# Patient Record
Sex: Male | Born: 1965
Health system: Southern US, Community
[De-identification: ages and names within clinical notes are randomized; demographics above are authoritative.]

---

## 2015-04-30 ENCOUNTER — Emergency Department (HOSPITAL_BASED_OUTPATIENT_CLINIC_OR_DEPARTMENT_OTHER): Payer: 59

## 2015-04-30 ENCOUNTER — Observation Stay (HOSPITAL_BASED_OUTPATIENT_CLINIC_OR_DEPARTMENT_OTHER)
Admission: EM | Admit: 2015-04-30 | Discharge: 2015-05-01 | Disposition: A | Discharge status: Home / Self Care | Payer: 59 | Attending: Surgery | Admitting: Surgery

## 2015-04-30 ENCOUNTER — Emergency Department (HOSPITAL_COMMUNITY): Payer: 59 | Admitting: Certified Registered"

## 2015-04-30 ENCOUNTER — Encounter (HOSPITAL_BASED_OUTPATIENT_CLINIC_OR_DEPARTMENT_OTHER): Payer: Self-pay | Admitting: Emergency Medicine

## 2015-04-30 ENCOUNTER — Encounter (HOSPITAL_COMMUNITY)
Admission: EM | Disposition: A | Discharge status: Home / Self Care | Payer: Self-pay | Source: Home / Self Care | Attending: Emergency Medicine

## 2015-04-30 DIAGNOSIS — K353 Acute appendicitis with localized peritonitis: Secondary | ICD-10-CM | POA: Diagnosis not present

## 2015-04-30 DIAGNOSIS — Z9049 Acquired absence of other specified parts of digestive tract: Secondary | ICD-10-CM

## 2015-04-30 DIAGNOSIS — K37 Unspecified appendicitis: Secondary | ICD-10-CM

## 2015-04-30 DIAGNOSIS — R1031 Right lower quadrant pain: Secondary | ICD-10-CM | POA: Diagnosis present

## 2015-04-30 DIAGNOSIS — K3580 Unspecified acute appendicitis: Secondary | ICD-10-CM

## 2015-04-30 HISTORY — PX: LAPAROSCOPIC APPENDECTOMY: SHX408

## 2015-04-30 LAB — COMPREHENSIVE METABOLIC PANEL
ALT: 28 U/L (ref 17–63)
ANION GAP: 7 (ref 5–15)
AST: 38 U/L (ref 15–41)
Albumin: 4.2 g/dL (ref 3.5–5.0)
Alkaline Phosphatase: 76 U/L (ref 38–126)
BILIRUBIN TOTAL: 1.2 mg/dL (ref 0.3–1.2)
BUN: 12 mg/dL (ref 6–20)
CHLORIDE: 104 mmol/L (ref 101–111)
CO2: 25 mmol/L (ref 22–32)
Calcium: 8.9 mg/dL (ref 8.9–10.3)
Creatinine, Ser: 1.34 mg/dL — ABNORMAL HIGH (ref 0.61–1.24)
GFR calc Af Amer: >60 mL/min (ref >60)
Glucose, Bld: 110 mg/dL — ABNORMAL HIGH (ref 65–99)
POTASSIUM: 3.9 mmol/L (ref 3.5–5.1)
Sodium: 136 mmol/L (ref 135–145)
TOTAL PROTEIN: 8 g/dL (ref 6.5–8.1)

## 2015-04-30 LAB — CBC
HCT: 40.7 % (ref 39.0–52.0)
HEMATOCRIT: 43.3 % (ref 39.0–52.0)
HEMOGLOBIN: 14.6 g/dL (ref 13.0–17.0)
Hemoglobin: 13.5 g/dL (ref 13.0–17.0)
MCH: 29.1 pg (ref 26.0–34.0)
MCH: 29.7 pg (ref 26.0–34.0)
MCHC: 33.2 g/dL (ref 30.0–36.0)
MCHC: 33.7 g/dL (ref 30.0–36.0)
MCV: 86.3 fL (ref 78.0–100.0)
MCV: 89.5 fL (ref 78.0–100.0)
PLATELETS: 216 10*3/uL (ref 150–400)
Platelets: 225 10*3/uL (ref 150–400)
RBC: 4.55 MIL/uL (ref 4.22–5.81)
RBC: 5.02 MIL/uL (ref 4.22–5.81)
RDW: 13.5 % (ref 11.5–15.5)
RDW: 13.7 % (ref 11.5–15.5)
WBC: 11 10*3/uL — AB (ref 4.0–10.5)
WBC: 11.4 10*3/uL — AB (ref 4.0–10.5)

## 2015-04-30 LAB — URINALYSIS, ROUTINE W REFLEX MICROSCOPIC
Bilirubin Urine: NEGATIVE
GLUCOSE, UA: NEGATIVE mg/dL
Ketones, ur: NEGATIVE mg/dL
LEUKOCYTES UA: NEGATIVE
NITRITE: NEGATIVE
PH: 6 (ref 5.0–8.0)
Protein, ur: NEGATIVE mg/dL
SPECIFIC GRAVITY, URINE: 1.019 (ref 1.005–1.030)

## 2015-04-30 LAB — DIFFERENTIAL
BASOS ABS: 0 10*3/uL (ref 0.0–0.1)
BASOS PCT: 0 %
EOS ABS: 0.1 10*3/uL (ref 0.0–0.7)
EOS PCT: 1 %
LYMPHS ABS: 2.2 10*3/uL (ref 0.7–4.0)
Lymphocytes Relative: 20 %
Monocytes Absolute: 1.1 10*3/uL — ABNORMAL HIGH (ref 0.1–1.0)
Monocytes Relative: 10 %
NEUTROS PCT: 69 %
Neutro Abs: 7.8 10*3/uL — ABNORMAL HIGH (ref 1.7–7.7)

## 2015-04-30 LAB — URINE MICROSCOPIC-ADD ON

## 2015-04-30 LAB — LIPASE, BLOOD: LIPASE: 34 U/L (ref 11–51)

## 2015-04-30 LAB — CREATININE, SERUM
CREATININE: 1.38 mg/dL — AB (ref 0.61–1.24)
GFR, EST NON AFRICAN AMERICAN: 60 mL/min — AB (ref >60)

## 2015-04-30 SURGERY — APPENDECTOMY, LAPAROSCOPIC
Anesthesia: General | Site: Abdomen

## 2015-04-30 MED ORDER — ONDANSETRON HCL 4 MG/2ML IJ SOLN
4.0000 mg | Freq: Once | INTRAMUSCULAR | Status: AC
  Administered 2015-04-30: 4 mg via INTRAVENOUS
  Filled 2015-04-30: qty 2

## 2015-04-30 MED ORDER — ONDANSETRON HCL 4 MG/2ML IJ SOLN
INTRAMUSCULAR | Status: DC | PRN
  Administered 2015-04-30: 4 mg via INTRAVENOUS

## 2015-04-30 MED ORDER — ONDANSETRON 4 MG PO TBDP: 4.0000 mg | ORAL_TABLET | Freq: Four times a day (QID) | ORAL | Status: DC | PRN

## 2015-04-30 MED ORDER — SODIUM CHLORIDE 0.9 % IV SOLN
INTRAVENOUS | Status: DC
  Administered 2015-04-30 (×2): via INTRAVENOUS

## 2015-04-30 MED ORDER — ONDANSETRON HCL 4 MG/2ML IJ SOLN: 4.0000 mg | Freq: Four times a day (QID) | INTRAMUSCULAR | Status: DC | PRN

## 2015-04-30 MED ORDER — SUCCINYLCHOLINE CHLORIDE 20 MG/ML IJ SOLN
INTRAMUSCULAR | Status: DC | PRN
  Administered 2015-04-30: 160 mg via INTRAVENOUS

## 2015-04-30 MED ORDER — BUPIVACAINE-EPINEPHRINE 0.25% -1:200000 IJ SOLN
INTRAMUSCULAR | Status: DC | PRN
  Administered 2015-04-30: 50 mL

## 2015-04-30 MED ORDER — PIPERACILLIN-TAZOBACTAM 3.375 G IVPB 30 MIN
3.3750 g | Freq: Three times a day (TID) | INTRAVENOUS | Status: AC
  Administered 2015-04-30: 3.375 g via INTRAVENOUS
  Filled 2015-04-30: qty 50

## 2015-04-30 MED ORDER — ONDANSETRON HCL 4 MG/2ML IJ SOLN
INTRAMUSCULAR | Status: AC
  Filled 2015-04-30: qty 2

## 2015-04-30 MED ORDER — FENTANYL CITRATE (PF) 250 MCG/5ML IJ SOLN
INTRAMUSCULAR | Status: AC
  Filled 2015-04-30: qty 5

## 2015-04-30 MED ORDER — PROPOFOL 10 MG/ML IV BOLUS
INTRAVENOUS | Status: AC
  Filled 2015-04-30: qty 20

## 2015-04-30 MED ORDER — MEPERIDINE HCL 50 MG/ML IJ SOLN: 6.2500 mg | INTRAMUSCULAR | Status: DC | PRN

## 2015-04-30 MED ORDER — GLYCOPYRROLATE 0.2 MG/ML IJ SOLN
INTRAMUSCULAR | Status: DC | PRN
  Administered 2015-04-30: 0.6 mg via INTRAVENOUS

## 2015-04-30 MED ORDER — MORPHINE SULFATE (PF) 4 MG/ML IV SOLN
4.0000 mg | Freq: Once | INTRAVENOUS | Status: AC
  Administered 2015-04-30: 4 mg via INTRAVENOUS
  Filled 2015-04-30: qty 1

## 2015-04-30 MED ORDER — HEPARIN SODIUM (PORCINE) 5000 UNIT/ML IJ SOLN
5000.0000 [IU] | Freq: Three times a day (TID) | INTRAMUSCULAR | Status: DC
  Administered 2015-04-30 – 2015-05-01 (×2): 5000 [IU] via SUBCUTANEOUS
  Filled 2015-04-30 (×5): qty 1

## 2015-04-30 MED ORDER — MIDAZOLAM HCL 5 MG/5ML IJ SOLN
INTRAMUSCULAR | Status: DC | PRN
  Administered 2015-04-30: 2 mg via INTRAVENOUS

## 2015-04-30 MED ORDER — KETOROLAC TROMETHAMINE 30 MG/ML IJ SOLN: 30.0000 mg | Freq: Once | INTRAMUSCULAR | Status: DC

## 2015-04-30 MED ORDER — HYDROCODONE-ACETAMINOPHEN 5-325 MG PO TABS
1.0000 | ORAL_TABLET | ORAL | Status: DC | PRN
  Administered 2015-04-30 (×2): 1 via ORAL
  Administered 2015-05-01: 2 via ORAL
  Filled 2015-04-30: qty 2
  Filled 2015-04-30 (×2): qty 1

## 2015-04-30 MED ORDER — IOHEXOL 300 MG/ML  SOLN
25.0000 mL | Freq: Once | INTRAMUSCULAR | Status: AC | PRN
  Administered 2015-04-30: 25 mL via ORAL

## 2015-04-30 MED ORDER — SODIUM CHLORIDE 0.9 % IV BOLUS (SEPSIS)
1000.0000 mL | Freq: Once | INTRAVENOUS | Status: AC
  Administered 2015-04-30: 1000 mL via INTRAVENOUS

## 2015-04-30 MED ORDER — KETOROLAC TROMETHAMINE 30 MG/ML IJ SOLN
INTRAMUSCULAR | Status: AC
  Administered 2015-04-30: 30 mg via INTRAVENOUS
  Filled 2015-04-30: qty 1

## 2015-04-30 MED ORDER — PHENYLEPHRINE HCL 10 MG/ML IJ SOLN
INTRAMUSCULAR | Status: DC | PRN
  Administered 2015-04-30: 40 ug via INTRAVENOUS

## 2015-04-30 MED ORDER — MORPHINE SULFATE (PF) 2 MG/ML IV SOLN: 1.0000 mg | INTRAVENOUS | Status: DC | PRN

## 2015-04-30 MED ORDER — MIDAZOLAM HCL 2 MG/2ML IJ SOLN
INTRAMUSCULAR | Status: AC
  Filled 2015-04-30: qty 2

## 2015-04-30 MED ORDER — HYDROMORPHONE HCL 1 MG/ML IJ SOLN: 0.2500 mg | INTRAMUSCULAR | Status: DC | PRN

## 2015-04-30 MED ORDER — LIDOCAINE HCL (CARDIAC) 20 MG/ML IV SOLN
INTRAVENOUS | Status: AC
  Filled 2015-04-30: qty 5

## 2015-04-30 MED ORDER — PHENYLEPHRINE 40 MCG/ML (10ML) SYRINGE FOR IV PUSH (FOR BLOOD PRESSURE SUPPORT)
PREFILLED_SYRINGE | INTRAVENOUS | Status: AC
  Filled 2015-04-30: qty 10

## 2015-04-30 MED ORDER — LACTATED RINGERS IR SOLN
Status: DC | PRN
  Administered 2015-04-30: 1000 mL

## 2015-04-30 MED ORDER — PROMETHAZINE HCL 25 MG/ML IJ SOLN: 6.2500 mg | INTRAMUSCULAR | Status: DC | PRN

## 2015-04-30 MED ORDER — BUPIVACAINE-EPINEPHRINE 0.25% -1:200000 IJ SOLN
INTRAMUSCULAR | Status: AC
  Filled 2015-04-30: qty 1

## 2015-04-30 MED ORDER — DEXAMETHASONE SODIUM PHOSPHATE 10 MG/ML IJ SOLN
INTRAMUSCULAR | Status: DC | PRN
  Administered 2015-04-30: 10 mg via INTRAVENOUS

## 2015-04-30 MED ORDER — HYDRALAZINE HCL 20 MG/ML IJ SOLN: 10.0000 mg | INTRAMUSCULAR | Status: DC | PRN

## 2015-04-30 MED ORDER — HEPARIN SODIUM (PORCINE) 5000 UNIT/ML IJ SOLN
5000.0000 [IU] | Freq: Three times a day (TID) | INTRAMUSCULAR | Status: DC
  Filled 2015-04-30 (×3): qty 1

## 2015-04-30 MED ORDER — DEXAMETHASONE SODIUM PHOSPHATE 10 MG/ML IJ SOLN
INTRAMUSCULAR | Status: AC
  Filled 2015-04-30: qty 1

## 2015-04-30 MED ORDER — FENTANYL CITRATE (PF) 100 MCG/2ML IJ SOLN
INTRAMUSCULAR | Status: DC | PRN
  Administered 2015-04-30: 100 ug via INTRAVENOUS
  Administered 2015-04-30: 50 ug via INTRAVENOUS
  Administered 2015-04-30: 100 ug via INTRAVENOUS

## 2015-04-30 MED ORDER — KCL IN DEXTROSE-NACL 20-5-0.45 MEQ/L-%-% IV SOLN
INTRAVENOUS | Status: DC
  Administered 2015-04-30: 100 mL/h via INTRAVENOUS
  Administered 2015-04-30: 22:00:00 via INTRAVENOUS
  Filled 2015-04-30 (×3): qty 1000

## 2015-04-30 MED ORDER — PROPOFOL 10 MG/ML IV BOLUS
INTRAVENOUS | Status: DC | PRN
  Administered 2015-04-30: 200 mg via INTRAVENOUS

## 2015-04-30 MED ORDER — NEOSTIGMINE METHYLSULFATE 10 MG/10ML IV SOLN
INTRAVENOUS | Status: AC
  Filled 2015-04-30: qty 1

## 2015-04-30 MED ORDER — 0.9 % SODIUM CHLORIDE (POUR BTL) OPTIME
TOPICAL | Status: DC | PRN
  Administered 2015-04-30: 1000 mL

## 2015-04-30 MED ORDER — ROCURONIUM BROMIDE 100 MG/10ML IV SOLN
INTRAVENOUS | Status: AC
  Filled 2015-04-30: qty 1

## 2015-04-30 MED ORDER — PANTOPRAZOLE SODIUM 40 MG IV SOLR
40.0000 mg | Freq: Every day | INTRAVENOUS | Status: DC
  Administered 2015-04-30: 40 mg via INTRAVENOUS
  Filled 2015-04-30 (×2): qty 40

## 2015-04-30 MED ORDER — PIPERACILLIN-TAZOBACTAM 3.375 G IVPB
3.3750 g | Freq: Once | INTRAVENOUS | Status: AC
  Administered 2015-04-30: 3.375 g via INTRAVENOUS
  Filled 2015-04-30: qty 50

## 2015-04-30 MED ORDER — LIDOCAINE HCL (CARDIAC) 20 MG/ML IV SOLN
INTRAVENOUS | Status: DC | PRN
  Administered 2015-04-30: 100 mg via INTRAVENOUS

## 2015-04-30 MED ORDER — ROCURONIUM BROMIDE 100 MG/10ML IV SOLN
INTRAVENOUS | Status: DC | PRN
  Administered 2015-04-30: 40 mg via INTRAVENOUS

## 2015-04-30 MED ORDER — NEOSTIGMINE METHYLSULFATE 10 MG/10ML IV SOLN
INTRAVENOUS | Status: DC | PRN
  Administered 2015-04-30: 4 mg via INTRAVENOUS

## 2015-04-30 MED ORDER — IOHEXOL 300 MG/ML  SOLN
100.0000 mL | Freq: Once | INTRAMUSCULAR | Status: AC | PRN
  Administered 2015-04-30: 100 mL via INTRAVENOUS

## 2015-04-30 SURGICAL SUPPLY — 33 items
APPLIER CLIP ROT 10 11.4 M/L (STAPLE)
CABLE HIGH FREQUENCY MONO STRZ (ELECTRODE) ×3 IMPLANT
CLIP APPLIE ROT 10 11.4 M/L (STAPLE) IMPLANT
COVER SURGICAL LIGHT HANDLE (MISCELLANEOUS) ×3 IMPLANT
CUTTER FLEX LINEAR 45M (STAPLE) ×3 IMPLANT
DECANTER SPIKE VIAL GLASS SM (MISCELLANEOUS) ×3 IMPLANT
DRAPE LAPAROSCOPIC ABDOMINAL (DRAPES) ×3 IMPLANT
ELECT REM PT RETURN 9FT ADLT (ELECTROSURGICAL) ×3
ELECTRODE REM PT RTRN 9FT ADLT (ELECTROSURGICAL) ×1 IMPLANT
ENDOLOOP SUT PDS II  0 18 (SUTURE)
ENDOLOOP SUT PDS II 0 18 (SUTURE) IMPLANT
GLOVE BIOGEL M 8.0 STRL (GLOVE) ×3 IMPLANT
GOWN STRL REUS W/TWL XL LVL3 (GOWN DISPOSABLE) ×6 IMPLANT
KIT BASIN OR (CUSTOM PROCEDURE TRAY) ×3 IMPLANT
LIQUID BAND (GAUZE/BANDAGES/DRESSINGS) ×3 IMPLANT
POUCH RETRIEVAL ECOSAC 10 (ENDOMECHANICALS) IMPLANT
POUCH RETRIEVAL ECOSAC 10MM (ENDOMECHANICALS)
POUCH SPECIMEN RETRIEVAL 10MM (ENDOMECHANICALS) ×3 IMPLANT
RELOAD 45 VASCULAR/THIN (ENDOMECHANICALS) ×3 IMPLANT
RELOAD STAPLE TA45 3.5 REG BLU (ENDOMECHANICALS) IMPLANT
SCISSORS LAP 5X45 EPIX DISP (ENDOMECHANICALS) IMPLANT
SCRUB PCMX 4 OZ (MISCELLANEOUS) ×3 IMPLANT
SET IRRIG TUBING LAPAROSCOPIC (IRRIGATION / IRRIGATOR) ×3 IMPLANT
SHEARS HARMONIC ACE PLUS 45CM (MISCELLANEOUS) ×3 IMPLANT
SLEEVE XCEL OPT CAN 5 100 (ENDOMECHANICALS) ×3 IMPLANT
STAPLER VISISTAT 35W (STAPLE) IMPLANT
SUT VIC AB 4-0 SH 18 (SUTURE) ×3 IMPLANT
TOWEL OR 17X26 10 PK STRL BLUE (TOWEL DISPOSABLE) ×6 IMPLANT
TRAY FOLEY W/METER SILVER 14FR (SET/KITS/TRAYS/PACK) ×3 IMPLANT
TRAY LAPAROSCOPIC (CUSTOM PROCEDURE TRAY) ×3 IMPLANT
TROCAR BLADELESS OPT 5 100 (ENDOMECHANICALS) ×3 IMPLANT
TROCAR XCEL BLUNT TIP 100MML (ENDOMECHANICALS) ×3 IMPLANT
TROCAR XCEL NON-BLD 11X100MML (ENDOMECHANICALS) IMPLANT

## 2015-04-30 NOTE — ED Notes (Signed)
Bed: WA17 Expected date:  Expected time:  Means of arrival:  Comments: [facility]

## 2015-04-30 NOTE — ED Provider Notes (Signed)
TIME SEEN: 1:30 AM  CHIEF COMPLAINT: Abdominal pain  HPI: Patient is a 50 year old male with no significant past medical history who presents the emergency department with right-sided lower abdominal pain he describes as a soreness that is constant for the past 2 days. No radiation of pain. No aggravating or relieving factors. He has had chills. No nausea, vomiting or diarrhea. No bloody stools or melena. No dysuria, hematuria, testicular pain or swelling, penile discharge. No prior history of abdominal surgery. No recent travel or sick contacts.  ROS: See HPI Constitutional: no fever  Eyes: no drainage  ENT: no runny nose   Cardiovascular:  no chest pain  Resp: no SOB  GI: no vomiting GU: no dysuria Integumentary: no rash  Allergy: no hives  Musculoskeletal: no leg swelling  Neurological: no slurred speech ROS otherwise negative  PAST MEDICAL HISTORY/PAST SURGICAL HISTORY:  History reviewed. No pertinent past medical history.  MEDICATIONS:  Prior to Admission medications   Not on File    ALLERGIES:  No Known Allergies  SOCIAL HISTORY:  Social History  Substance Use Topics  . Smoking status: Never Smoker   . Smokeless tobacco: Not on file  . Alcohol Use: Yes     Comment: occ    FAMILY HISTORY: History reviewed. No pertinent family history.  EXAM: BP 136/91 mmHg  Pulse 86  Temp(Src) 98.9 F (37.2 C) (Oral)  Resp 18  Ht 6\' 1"  (1.854 m)  Wt 225 lb (102.059 kg)  BMI 29.69 kg/m2  SpO2 100% CONSTITUTIONAL: Alert and oriented and responds appropriately to questions. Well-appearing; well-nourished HEAD: Normocephalic EYES: Conjunctivae clear, PERRL ENT: normal nose; no rhinorrhea; moist mucous membranes; pharynx without lesions noted NECK: Supple, no meningismus, no LAD  CARD: RRR; S1 and S2 appreciated; no murmurs, no clicks, no rubs, no gallops RESP: Normal chest excursion without splinting or tachypnea; breath sounds clear and equal bilaterally; no wheezes, no  rhonchi, no rales, no hypoxia or respiratory distress, speaking full sentences ABD/GI: Normal bowel sounds; non-distended; soft, tender to palpation throughout the right abdomen mostly in the right lower quadrant with mild voluntary guarding intermittently, no rebound or peritoneal signs BACK:  The back appears normal and is non-tender to palpation, there is no CVA tenderness EXT: Normal ROM in all joints; non-tender to palpation; no edema; normal capillary refill; no cyanosis, no calf tenderness or swelling    SKIN: Normal color for age and race; warm NEURO: Moves all extremities equally, sensation to light touch intact diffusely, cranial nerves II through XII intact PSYCH: The patient's mood and manner are appropriate. Grooming and personal hygiene are appropriate.  MEDICAL DECISION MAKING: Patient here with right lower quadrant tenderness. He is hemodynamically stable. Abdomen is not peritoneal. Concern for possible appendicitis. Labs ordered in triage show mild leukocytosis with left shift. He also has mild elevation of his creatinine. Urine shows large hemoglobin, rare bacteria. He has no urinary symptoms. We'll obtain CT of his abdomen and pelvis for further evaluation. Will give IV fluids, morphine, Zofran.  ED PROGRESS: CT scan shows acute uncomplicated appendicitis. No perforation or abscess. We'll give Zosyn. Will continue IV hydration and keep him NPO.  Discussed with Dr. Daphine DeutscherMartin [facility] who agrees to see patient in the emergency department. Discussed with Dr[facility] Clydene [facility]ughKnott [facility] to agrees to accept the patient in transfer. Discussed with patient and family who are comfortable with this plan. We will transfer by [facility].     Layla MawKristen N  Zakai Gonyea, DO 04/30/15 1610

## 2015-04-30 NOTE — Anesthesia Postprocedure Evaluation (Signed)
Anesthesia Post Note  Patient: Kevin Sandoval  Procedure(s) Performed: Procedure(s) (LRB): APPENDECTOMY LAPAROSCOPIC (N/A)  Patient location during evaluation: PACU Anesthesia Type: General Level of consciousness: awake Pain management: pain level controlled Vital Signs Assessment: post-procedure vital signs reviewed and stable Respiratory status: spontaneous breathing Cardiovascular status: stable Postop Assessment: no signs of nausea or vomiting Anesthetic complications: no    Last Vitals:  Filed Vitals:   04/30/15 0424 04/30/15 0430  BP: 145/92 149/93  Pulse: 80 77  Temp: 36.7 C   Resp: 20     Last Pain:  Filed Vitals:   04/30/15 0734  PainSc: 6                  Palin Tristan JR,JOHN Kathryn Cosby

## 2015-04-30 NOTE — Anesthesia Preprocedure Evaluation (Addendum)
Anesthesia Evaluation  Patient identified by MRN, date of birth, ID band Patient awake    Reviewed: Allergy & Precautions, H&P , Patient's Chart, lab work & pertinent test results  Airway Mallampati: I  TM Distance: >3 FB Neck ROM: full    Dental no notable dental hx. (+) Teeth Intact   Pulmonary neg pulmonary ROS,    Pulmonary exam normal        Cardiovascular negative cardio ROS Normal cardiovascular exam     Neuro/Psych negative neurological ROS  negative psych ROS   GI/Hepatic negative GI ROS, Neg liver ROS,   Endo/Other  negative endocrine ROS  Renal/GU negative Renal ROS     Musculoskeletal   Abdominal Normal abdominal exam  (+)   Peds  Hematology negative hematology ROS (+)   Anesthesia Other Findings   Reproductive/Obstetrics negative OB ROS                             Anesthesia Physical Anesthesia Plan  ASA: I and emergent  Anesthesia Plan: General   Post-op Pain Management:    Induction: Intravenous  Airway Management Planned: Oral ETT  Additional Equipment:   Intra-op Plan:   Post-operative Plan: Extubation in OR  Informed Consent: I have reviewed the patients History and Physical, chart, labs and discussed the procedure including the risks, benefits and alternatives for the proposed anesthesia with the patient or authorized representative who has indicated his/her understanding and acceptance.   Dental Advisory Given  Plan Discussed with: CRNA and Surgeon  Anesthesia Plan Comments:        Anesthesia Quick Evaluation

## 2015-04-30 NOTE — Transfer of Care (Signed)
Immediate Anesthesia Transfer of Care Note  Patient: Kevin Sandoval  Procedure(s) Performed: Procedure(s): APPENDECTOMY LAPAROSCOPIC (N/A)  Patient Location: PACU  Anesthesia Type:General  Level of Consciousness: awake, sedated and responds to stimulation  Airway & Oxygen Therapy: Patient Spontanous Breathing and Patient connected to face mask oxygen  Post-op Assessment: Report given to RN and Post -op Vital signs reviewed and stable  Post vital signs: Reviewed and stable  Last Vitals:  Filed Vitals:   04/30/15 0424 04/30/15 0430  BP: 145/92 149/93  Pulse: 80 77  Temp: 36.7 C   Resp: 20     Complications: No apparent anesthesia complications

## 2015-04-30 NOTE — ED Notes (Signed)
Patient states that he has had abdominal pain x 2 days. Denies any any N/V/D

## 2015-04-30 NOTE — H&P (Signed)
Chief Complaint:  Right lower quadrant pain for 2 days with [facility]  History of Present Illness:  Kevin Sandoval is an 50 y.o. male who [facility] with diagnosis of appendicitis made by CT scan. Patient has had 2 days of abdominal pain with localization in the right lower quadrant. Informed consent was obtained regarding laparoscopic and open appendectomy in the emergency room. He seemed to understand the indications and risks and we will proceed.  He denies any chronic medical problems. He takes no medications. He reported no known allergies.  History reviewed. No pertinent past medical history.  History reviewed. No pertinent past surgical history.  Current Facility-Administered Medications  Medication Dose Route Frequency Provider Last Rate Last Dose  . 0.9 %  sodium chloride infusion   Intravenous Continuous Kristen N Ward, DO 125 mL/hr at 04/30/15 1601     No current outpatient prescriptions on file.   Review of patient's allergies indicates no known allergies. History reviewed. No pertinent family history. Social History:   reports that he has never smoked. He does not have any smokeless tobacco history on file. He reports that he drinks alcohol. He reports that he does not use illicit drugs.   REVIEW OF SYSTEMS : Negative except for none noted  Physical Exam:   Blood pressure 149/93, pulse 77, temperature 98 F (36.7 C), temperature source Oral, resp. rate 20, height 6' 1"  (1.854 m), weight 102.059 kg (225 lb), SpO2 96 %. Body mass index is 29.69 kg/(m^2).  Gen:  WDWN African-American male NAD  Neurological: Alert and oriented to person, place, and time. Motor and sensory function is grossly intact  Head: Normocephalic and atraumatic.  Eyes: Conjunctivae are normal. Pupils are equal, round, and reactive to light. No scleral icterus.  Neck: Normal range of motion. Neck supple. No tracheal deviation or  thyromegaly present.  Cardiovascular:  SR without murmurs or gallops.  No carotid bruits Breast:  Not examined Respiratory: Effort normal.  No respiratory distress. No chest wall tenderness. Breath sounds normal.  No wheezes, rales or rhonchi.  Abdomen:  Mildly obese and tender in the right lower quadrant GU:  Unremarkable Musculoskeletal: Normal range of motion. Extremities are nontender. No cyanosis, edema or clubbing noted Lymphadenopathy: No cervical, preauricular, postauricular or axillary adenopathy is present Skin: Skin is warm and dry. No rash noted. No diaphoresis. No erythema. No pallor. Pscyh: Normal mood and affect. Behavior is normal. Judgment and thought content normal.   LABORATORY RESULTS: Results for orders placed or performed during the hospital encounter of 04/30/15 (from the past 48 hour(s))  Lipase, blood     Status: None   Collection Time: 04/30/15 12:30 AM  Result Value Ref Range   Lipase 34 11 - 51 U/L  Comprehensive metabolic panel     Status: Abnormal   Collection Time: 04/30/15 12:30 AM  Result Value Ref Range   Sodium 136 135 - 145 mmol/L   Potassium 3.9 3.5 - 5.1 mmol/L   Chloride 104 101 - 111 mmol/L   CO2 25 22 - 32 mmol/L   Glucose, Bld 110 (H) 65 - 99 mg/dL   BUN 12 6 - 20 mg/dL   Creatinine, Ser 1.34 (H) 0.61 - 1.24 mg/dL   Calcium 8.9 8.9 - 10.3 mg/dL   Total Protein 8.0 6.5 - 8.1 g/dL   Albumin 4.2 3.5 - 5.0 g/dL   AST 38 15 - 41 U/L   ALT 28 17 -  63 U/L   Alkaline Phosphatase 76 38 - 126 U/L   Total Bilirubin 1.2 0.3 - 1.2 mg/dL   GFR calc non Af Amer >60 >60 mL/min   GFR calc Af Amer >60 >60 mL/min    Comment: (NOTE) The eGFR has been calculated using the CKD EPI equation. This calculation has not been validated in all clinical situations. eGFR's persistently <60 mL/min signify possible Chronic Kidney Disease.    Anion gap 7 5 - 15  CBC     Status: Abnormal   Collection Time: 04/30/15 12:30 AM  Result Value Ref Range   WBC 11.0 (H)  4.0 - 10.5 K/uL   RBC 5.02 4.22 - 5.81 MIL/uL   Hemoglobin 14.6 13.0 - 17.0 g/dL   HCT 43.3 39.0 - 52.0 %   MCV 86.3 78.0 - 100.0 fL   MCH 29.1 26.0 - 34.0 pg   MCHC 33.7 30.0 - 36.0 g/dL   RDW 13.5 11.5 - 15.5 %   Platelets 225 150 - 400 K/uL  Differential     Status: Abnormal   Collection Time: 04/30/15 12:30 AM  Result Value Ref Range   Neutrophils Relative % 69 %   Neutro Abs 7.8 (H) 1.7 - 7.7 K/uL   Lymphocytes Relative 20 %   Lymphs Abs 2.2 0.7 - 4.0 K/uL   Monocytes Relative 10 %   Monocytes Absolute 1.1 (H) 0.1 - 1.0 K/uL   Eosinophils Relative 1 %   Eosinophils Absolute 0.1 0.0 - 0.7 K/uL   Basophils Relative 0 %   Basophils Absolute 0.0 0.0 - 0.1 K/uL  Urinalysis, Routine w reflex microscopic (not at [facility])     Status: Abnormal   Collection Time: 04/30/15  1:30 AM  Result Value Ref Range   Color, Urine YELLOW YELLOW   APPearance CLEAR CLEAR   Specific Gravity, Urine 1.019 1.005 - 1.030   pH 6.0 5.0 - 8.0   Glucose, UA NEGATIVE NEGATIVE mg/dL   Hgb urine dipstick LARGE (A) NEGATIVE   Bilirubin Urine NEGATIVE NEGATIVE   Ketones, ur NEGATIVE NEGATIVE mg/dL   Protein, ur NEGATIVE NEGATIVE mg/dL   Nitrite NEGATIVE NEGATIVE   Leukocytes, UA NEGATIVE NEGATIVE  Urine microscopic-add on     Status: Abnormal   Collection Time: 04/30/15  1:30 AM  Result Value Ref Range   Squamous Epithelial / LPF 0-5 (A) NONE SEEN   WBC, UA 0-5 0 - 5 WBC/hpf   RBC / HPF 6-30 0 - 5 RBC/hpf   Bacteria, UA RARE (A) NONE SEEN   Urine-Other MUCOUS PRESENT      RADIOLOGY RESULTS: Ct Abdomen Pelvis W Contrast  04/30/2015  CLINICAL DATA:  RIGHT-sided abdominal pain for 2 days. EXAM: CT ABDOMEN AND PELVIS WITH CONTRAST TECHNIQUE: Multidetector CT imaging of the abdomen and pelvis was performed using the standard protocol following bolus administration of intravenous contrast. CONTRAST:  35m OMNIPAQUE IOHEXOL 300 MG/ML SOLN, 1071mOMNIPAQUE IOHEXOL 300 MG/ML SOLN COMPARISON:  None. FINDINGS:  LUNG BASES: Included view of the lung bases are clear. Visualized heart and pericardium are unremarkable. SOLID ORGANS: The liver demonstrates 3.9 x 3.3 cm (AP by transverse) irregular masslike hypodensity with faint centripetal puddling of contrast. Spleen, gallbladder, pancreas and adrenal glands are unremarkable. GASTROINTESTINAL TRACT: The stomach, small and large bowel are normal in course and caliber without inflammatory changes. The appendix is enlarged, 14 mm in transaxial dimension, hyperemic with periappendiceal inflammation. KIDNEYS/ URINARY TRACT: Kidneys are orthotopic, demonstrating symmetric enhancement. No nephrolithiasis, hydronephrosis or solid  renal masses. Too small to characterize hypodensities in the kidneys bilaterally. The unopacified ureters are normal in course and caliber. Urinary bladder is partially distended and unremarkable. PERITONEUM/RETROPERITONEUM: Aortoiliac vessels are normal in course and caliber. No lymphadenopathy by CT size criteria. Prostate size is normal. No intraperitoneal free fluid nor free air. SOFT TISSUE/OSSEOUS STRUCTURES: Non-suspicious. Small fat containing umbilical and LEFT inguinal hernia. Metallic foreign bodies partially imaged in the scrotal sac. IMPRESSION: Acute uncomplicated appendicitis. 3.9 x 3.3 cm suspected hemangioma RIGHT lobe of the liver, which would be better characterized on multiphasic MRI of the liver on a nonemergent basis. Acute findings discussed with and reconfirmed by Dr.KRISTEN WARD on 04/30/2015 at 2:46 am. Electronically Signed   By: Elon Alas M.D.   On: 04/30/2015 02:47    Problem List: Patient Active Problem List   Diagnosis Date Noted  . Appendicitis, acute Jan 2017 04/30/2015    Assessment & Plan: Acute appendicitis. Plan laparoscopic appendectomy    Matt B. Hassell Done, MD, [facility] [phone] beeper [phone]  04/30/2015 5:17 AM

## 2015-04-30 NOTE — ED Notes (Signed)
[facility] for [facility]. Pt is alert/oriented/ skin is warm and dry.

## 2015-04-30 NOTE — Anesthesia Procedure Notes (Signed)
Procedure Name: Intubation Date/Time: 04/30/2015 6:05 AM Performed by: Enriqueta ShutterWILLIFORD, Ryah Cribb D Pre-anesthesia Checklist: Patient identified, Emergency Drugs available, Suction available and Patient being monitored Patient Re-evaluated:Patient Re-evaluated prior to inductionOxygen Delivery Method: Circle System Utilized Preoxygenation: Pre-oxygenation with 100% oxygen Intubation Type: IV induction, Rapid sequence and Cricoid Pressure applied Laryngoscope Size: 4 and Mac Grade View: Grade I Tube type: Oral Tube size: 7.5 mm Number of attempts: 1 Airway Equipment and Method: Stylet and Oral airway Placement Confirmation: ETT inserted through vocal cords under direct vision,  positive ETCO2 and breath sounds checked- equal and bilateral Secured at: 22 cm Tube secured with: Tape Dental Injury: Teeth and Oropharynx as per pre-operative assessment

## 2015-04-30 NOTE — ED Notes (Signed)
Surgeon at bedside. Consent signed

## 2015-04-30 NOTE — ED Provider Notes (Signed)
Pt here from [facility] with acute appendicitis. Dr. Daphine DeutscherMartin has been notified. The patient is not in pain and doing well at the moment. Denies having any questions or concerns. Non toxic appearing.  Filed Vitals:   04/30/15 0424 04/30/15 0430  BP: 145/92 149/93  Pulse: 80 77  Temp: 98 F (36.7 C)   Resp: [address]20      Isaah Furry, PA-C 04/30/15 0458  Lyndal Pulleyaniel Knott, MD 05/01/15 [phone]

## 2015-04-30 NOTE — Op Note (Signed)
Surgeon: Wenda LowMatt Dezmen Alcock, MD, FACS  Asst:  none  Anes:  general  Procedure: Laparoscopic appendectomy  Diagnosis: Suppurative appendicitis  Complications: none  EBL:   minimal cc  Drains: none  Description of Procedure:  [facility]  After anesthesia was administered and the patient was prepped a timeout was performed.  Access achieved with Hasson technique through the umbilicus-there was a small umbilical hernia.  Two 5 mm ports were placed in the right upper and left lower quadrants.  The appendix was readily visualized as it projected straight down from the cecum.  The tip was purulent.  The base was dissected and a window made through the mesentery.  The base was amputated from cecum with 4.5 Ethicon with red/vascular load.  The mesentery was divided with the Harmonic scalpel.  The appendix was placed in a bag and retrieved though the umbilicus.  The umbilicus was closed with a figure of 8 of 0 vicryl and this was observed endoscopically.  The operative site was reinspected and everything appeared to be OK.  Port sites were injected with Marcaine.  The skin was closed with 4-0 vicryl and Liquiban.    The patient tolerated the procedure well and was taken to the PACU in stable condition.     Matt B. Daphine DeutscherMartin, MD, [facility], [place] [phone]

## 2015-05-01 ENCOUNTER — Encounter (HOSPITAL_COMMUNITY): Payer: Self-pay | Admitting: Surgery

## 2015-05-01 MED ORDER — ACETAMINOPHEN 325 MG PO TABS: 650.0000 mg | ORAL_TABLET | Freq: Four times a day (QID) | ORAL | Status: DC | PRN

## 2015-05-01 MED ORDER — IBUPROFEN 200 MG PO TABS: ORAL_TABLET | ORAL | Status: AC

## 2015-05-01 MED ORDER — ACETAMINOPHEN 325 MG PO TABS: 650.0000 mg | ORAL_TABLET | Freq: Four times a day (QID) | ORAL | Status: AC | PRN

## 2015-05-01 MED ORDER — PANTOPRAZOLE SODIUM 40 MG PO TBEC: 40.0000 mg | DELAYED_RELEASE_TABLET | Freq: Every day | ORAL | Status: DC

## 2015-05-01 MED ORDER — HYDROCODONE-ACETAMINOPHEN 5-325 MG PO TABS: 1.0000 | ORAL_TABLET | ORAL | Status: AC | PRN

## 2015-05-01 NOTE — Progress Notes (Signed)
Key Points: Use following P&T approved IV to PO antibiotic change policy.  Description contains the criteria that are approved Note: Policy Excludes:  Esophagectomy patientsPHARMACIST - PHYSICIAN COMMUNICATION DR:   Derrell LollingIngram CONCERNING: IV to Oral Route Change Policy  RECOMMENDATION: This patient is receiving Protonix by the intravenous route.  Based on criteria approved by the Pharmacy and Therapeutics Committee, the intravenous medication(s) is/are being converted to the equivalent oral dose form(s).   DESCRIPTION: These criteria include:  The patient is eating (either orally or via tube) and/or has been taking other orally administered medications for a least 24 hours  The patient has no evidence of active gastrointestinal bleeding or impaired GI absorption (gastrectomy, short bowel, patient on TNA or NPO).  If you have questions about this conversion, please contact the Pharmacy Department  []   [phone]( [phone] )  [facility] []   [phone]( [phone] )  [facility] []   [phone]( [phone] )  [facility] []   [phone]( [phone] )  [facility] [x]   [phone]( [phone] )  [facility]   Loralee PacasErin Dana Dorner, PharmD, BCPS  05/01/2015 7:46 AM

## 2015-05-01 NOTE — Progress Notes (Signed)
Discussed with patient and spouse discharge instructions, both verbalized agreement and understanding.  Patient's IV was discontinued with no complications.  Patient to go down in wheelchair with all belongings to go home in private vehicle. 

## 2015-05-01 NOTE — Progress Notes (Signed)
General Surgery:  Patient is doing well one day following laparoscopic appendectomy. ate fried chicken nuggets last night and tolerated them well. Minimal pain.  Would like to go home  Abdomen soft.  Minimal tenderness.  Wounds look okay  Plan discharge later this morning if he tolerates breakfast well. Diet, activities, and return to work issues discussed We will arrange follow-up in office.   Angelia MouldHaywood M. Derrell LollingIngram, M.D., [facility] General and Minimally invasive Surgery Breast and Colorectal Surgery Office:   [phone]

## 2015-05-01 NOTE — Discharge Summary (Signed)
Physician Discharge Summary  Patient ID: Kevin Sandoval MRN: [id number] DOB/AGE: 1966/03/10 50 y.o.  Admit date: 04/30/2015 Discharge date: 05/01/2015  Admission Diagnoses:  Acute appendicitis  Discharge Diagnoses:  Suppurative appendicitis  Active Problems:   Appendicitis, acute Jan 2017   S/P laparoscopic appendectomy   PROCEDURES: Laparoscopic appendectomy, 04/30/15, Dr. Charolett BumpersMartin  Hospital Course:  Kevin Sandoval is an 50 y.o. male who [facility] with diagnosis of appendicitis made by CT scan. Patient has had 2 days of abdominal pain with localization in the right lower quadrant. Informed consent was obtained regarding laparoscopic and open appendectomy in the emergency room. He seemed to understand the indications and risks and we will proceed. He denies any chronic medical problems. He takes no medications. He reported no known allergies. Pt admitted and taken to the OR same day.  He tolerated the procedure well and was eating regular food by the time I saw him on 05/01/15.  He was discharged home after that.  He was doing well.  No further antibiotics were needed at time of discharge.    Condition on d/c:  Improved  CBC Latest Ref Rng 04/30/2015 04/30/2015  WBC 4.0 - 10.5 K/uL 11.4(H) 11.0(H)  Hemoglobin 13.0 - 17.0 g/dL 40.913.5 81.114.6  Hematocrit 91.439.0 - 52.0 % 40.7 43.3  Platelets 150 - 400 K/uL 216 225   CMP Latest Ref Rng 04/30/2015 04/30/2015  Glucose 65 - 99 mg/dL - 782(N110(H)  BUN 6 - 20 mg/dL - 12  Creatinine 5.620.61 - 1.24 mg/dL 1.30(Q1.38(H) 6.57(Q1.34(H)  Sodium 135 - 145 mmol/L - 136  Potassium 3.5 - 5.1 mmol/L - 3.9  Chloride 101 - 111 mmol/L - 104  CO2 22 - 32 mmol/L - 25  Calcium 8.9 - 10.3 mg/dL - 8.9  Total Protein 6.5 - 8.1 g/dL - 8.0  Total Bilirubin 0.3 - 1.2 mg/dL - 1.2  Alkaline Phos 38 - 126 U/L - 76  AST 15 - 41 U/L - 38  ALT 17 - 63 U/L - 28     Disposition: 01-Home or Self Care     Medication List    TAKE these medications        acetaminophen 325 MG tablet  Commonly known as:  TYLENOL  Take 2 tablets (650 mg total) by mouth every 6 (six) hours as needed for mild pain, moderate pain, fever or headache.     HYDROcodone-acetaminophen 5-325 MG tablet  Commonly known as:  NORCO/VICODIN  Take 1-2 tablets by mouth every 4 (four) hours as needed for moderate pain.     ibuprofen 200 MG tablet  Commonly known as:  MOTRIN IB  You can take 2-3 tablets every 6 hours as needed for pain.  I would use this first and then the prescription medicine.           Follow-up Information    Follow up with [facility] On 05/17/2015.   Specialty:  General Surgery   Why:  Your appointment is at 10:45 AM, be there 30 minutes early for check in.   Contact information:   [address] [place] [address] [phone]       Signed: Sherrie GeorgeJENNINGS,Ceasia Elwell 05/01/2015, 2:18 PM

## 2015-05-01 NOTE — Discharge Instructions (Signed)
Laparoscopic Appendectomy, Adult, Care After These instructions give you information on caring for yourself after your procedure. Your doctor may also give you more specific instructions. Call your doctor if you have any problems or questions after your procedure. HOME CARE  Do not drive while taking pain medicine (narcotics).  Take medicine (stool softener) if you cannot poop (constipated).  Change your bandages (dressings) as told by your doctor.  Keep your wounds clean and dry. Wash the wounds gently with soap and water. Gently pat the wounds dry with a clean towel.  Do not take baths, swim, or use hot tubs for 10 days, or as told by your doctor.  Only take medicine as told by your doctor.  Continue your normal diet as told by your doctor.  Do not lift more than 10 pounds (4.5 kilograms) or play contact sports for 3 weeks, or as told by your doctor.  Slowly increase your activity.  Take deep breaths to avoid a lung infection (pneumonia). GET HELP RIGHT AWAY IF:   You have a fever.  You have a rash.  You have trouble breathing or sharp chest pain.  You have a reaction to the medicine you are taking.  Your wound is red, puffy (swollen), or painful.  You have yellowish-white fluid (pus) coming from the wound.  You have fluid coming from the wound for longer than 1 day.  You notice a bad smell coming from the wound or bandage.  Your wound breaks open after stitches (sutures) or staples are removed.  You have pain in the shoulders or shoulder blades.  You feel dizzy or pass out (faint).  You are short of breath.  You feel sick to your stomach (nauseous) or throw up (vomit).  You cannot control when you poop, or you lose your appetite.  You have watery poop (diarrhea). MAKE SURE YOU:  Understand these instructions.  Will watch your condition.  Will get help right away if you are not doing well or get worse.   This information is not intended to replace  advice given to you by your health care provider. Make sure you discuss any questions you have with your health care provider.   Document Released: 01/26/2009 Document Revised: 04/22/2014 Document Reviewed: 09/19/2014 Elsevier Interactive Patient Education 2016 [employer].  [facility] ______[facility] LAPAROSCOPIC SURGERY: POST OP INSTRUCTIONS Always review your discharge instruction sheet given to you by the facility where your surgery was performed. IF YOU HAVE DISABILITY OR FAMILY LEAVE FORMS, YOU MUST BRING THEM TO THE OFFICE FOR PROCESSING.   DO NOT GIVE THEM TO YOUR DOCTOR.  1. A prescription for pain medication may be given to you upon discharge.  Take your pain medication as prescribed, if needed.  If narcotic pain medicine is not needed, then you may take acetaminophen (Tylenol) or ibuprofen (Advil) as needed. 2. Take your usually prescribed medications unless otherwise directed. 3. If you need a refill on your pain medication, please contact your pharmacy.  They will contact our office to request authorization. Prescriptions will not be filled after 5pm or on week-ends. 4. You should follow a light diet the first few days after arrival home, such as soup and crackers, etc.  Be sure to include lots of fluids daily. 5. Most patients will experience some swelling and bruising in the area of the incisions.  Ice packs will help.  Swelling and bruising can take several days to resolve.  6. It is common to experience some constipation if  taking pain medication after surgery.  Increasing fluid intake and taking a stool softener (such as Colace) will usually help or prevent this problem from occurring.  A mild laxative (Milk of Magnesia or Miralax) should be taken according to package instructions if there are no bowel movements after 48 hours. 7. Unless discharge instructions indicate otherwise, you may remove your bandages 24-48 hours after surgery, and you may shower at that  time.  You may have steri-strips (small skin tapes) in place directly over the incision.  These strips should be left on the skin for 7-10 days.  If your surgeon used skin glue on the incision, you may shower in 24 hours.  The glue will flake off over the next 2-3 weeks.  Any sutures or staples will be removed at the office during your follow-up visit. 8. ACTIVITIES:  You may resume regular (light) daily activities beginning the next day--such as daily self-care, walking, climbing stairs--gradually increasing activities as tolerated.  You may have sexual intercourse when it is comfortable.  Refrain from any heavy lifting or straining until approved by your doctor. a. You may drive when you are no longer taking prescription pain medication, you can comfortably wear a seatbelt, and you can safely maneuver your car and apply brakes. b. RETURN TO WORK:  __________________________________________________________ 9. You should see your doctor in the office for a follow-up appointment approximately 2-3 weeks after your surgery.  Make sure that you call for this appointment within a day or two after you arrive home to insure a convenient appointment time. 10. OTHER INSTRUCTIONS: __________________________________________________________________________________________________________________________ __________________________________________________________________________________________________________________________ WHEN TO CALL YOUR DOCTOR: 1. Fever over 101.0 2. Inability to urinate 3. Continued bleeding from incision. 4. Increased pain, redness, or drainage from the incision. 5. Increasing abdominal pain  The clinic staff is available to answer your questions during regular business hours.  Please dont hesitate to call and ask to speak to one of the nurses for clinical concerns.  If you have a medical emergency, go to the nearest emergency room or call 911.  A surgeon from [facility] is  always on call at the hospital. [address], [address], [place], [place]  [address] ? [address] [address], [place], [place]   [id number] [phone] ? [phone] ? FAX [phone] Web site: [email or web address]

## 2015-06-29 ENCOUNTER — Emergency Department (HOSPITAL_BASED_OUTPATIENT_CLINIC_OR_DEPARTMENT_OTHER)
Admission: EM | Admit: 2015-06-29 | Discharge: 2015-06-29 | Disposition: A | Discharge status: Home / Self Care | Payer: 59 | Attending: Physician Assistant | Admitting: Physician Assistant

## 2015-06-29 ENCOUNTER — Other Ambulatory Visit: Payer: Self-pay

## 2015-06-29 ENCOUNTER — Emergency Department (HOSPITAL_BASED_OUTPATIENT_CLINIC_OR_DEPARTMENT_OTHER): Payer: 59

## 2015-06-29 ENCOUNTER — Encounter (HOSPITAL_BASED_OUTPATIENT_CLINIC_OR_DEPARTMENT_OTHER): Payer: Self-pay | Admitting: Emergency Medicine

## 2015-06-29 DIAGNOSIS — R079 Chest pain, unspecified: Secondary | ICD-10-CM | POA: Diagnosis present

## 2015-06-29 DIAGNOSIS — F17200 Nicotine dependence, unspecified, uncomplicated: Secondary | ICD-10-CM | POA: Insufficient documentation

## 2015-06-29 DIAGNOSIS — R05 Cough: Secondary | ICD-10-CM

## 2015-06-29 DIAGNOSIS — G933 Postviral fatigue syndrome: Secondary | ICD-10-CM | POA: Diagnosis not present

## 2015-06-29 DIAGNOSIS — R058 Other specified cough: Secondary | ICD-10-CM

## 2015-06-29 LAB — CBC
HEMATOCRIT: 44.6 % (ref 39.0–52.0)
Hemoglobin: 15.1 g/dL (ref 13.0–17.0)
MCH: 29.3 pg (ref 26.0–34.0)
MCHC: 33.9 g/dL (ref 30.0–36.0)
MCV: 86.4 fL (ref 78.0–100.0)
PLATELETS: 222 10*3/uL (ref 150–400)
RBC: 5.16 MIL/uL (ref 4.22–5.81)
RDW: 13.6 % (ref 11.5–15.5)
WBC: 4.7 10*3/uL (ref 4.0–10.5)

## 2015-06-29 LAB — BASIC METABOLIC PANEL
Anion gap: 8 (ref 5–15)
BUN: 14 mg/dL (ref 6–20)
CO2: 25 mmol/L (ref 22–32)
CREATININE: 1.3 mg/dL — AB (ref 0.61–1.24)
Calcium: 8.9 mg/dL (ref 8.9–10.3)
Chloride: 104 mmol/L (ref 101–111)
GFR calc Af Amer: >60 mL/min (ref >60)
Glucose, Bld: 66 mg/dL (ref 65–99)
POTASSIUM: 4.2 mmol/L (ref 3.5–5.1)
SODIUM: 137 mmol/L (ref 135–145)

## 2015-06-29 LAB — TROPONIN I: Troponin I: <0.03 ng/mL (ref <0.031)

## 2015-06-29 MED ORDER — GUAIFENESIN-CODEINE 100-10 MG/5ML PO SOLN: 5.0000 mL | Freq: Four times a day (QID) | ORAL | Status: AC | PRN

## 2015-06-29 MED ORDER — BENZONATATE 100 MG PO CAPS: 100.0000 mg | ORAL_CAPSULE | Freq: Three times a day (TID) | ORAL | Status: AC | PRN

## 2015-06-29 MED FILL — CHERATUSSIN AC SYRUP: 100-10 | 4 days supply | Qty: 120 | Fill #0

## 2015-06-29 MED FILL — BENZONATATE 100 MG CAPSULE: 100 | 7 days supply | Qty: 20 | Fill #0

## 2015-06-29 NOTE — Discharge Instructions (Signed)
Your chest xray, ekg and labs were all normal.   We gave you two cough medicines. One is for the daytime (tessalon perles) The other with codeine is only for night. Do not drive or drink with this medication. Plase follow up with your regular physician as needed if cough continues.   Cool Mist Vaporizers Vaporizers may help relieve the symptoms of a cough and cold. They add moisture to the air, which helps mucus to become thinner and less sticky. This makes it easier to breathe and cough up secretions. Cool mist vaporizers do not cause serious burns like hot mist vaporizers, which may also be called steamers or humidifiers. Vaporizers have not been proven to help with colds. You should not use a vaporizer if you are allergic to mold. HOME CARE INSTRUCTIONS  Follow the package instructions for the vaporizer.  Do not use anything other than distilled water in the vaporizer.  Do not run the vaporizer all of the time. This can cause mold or bacteria to grow in the vaporizer.  Clean the vaporizer after each time it is used.  Clean and dry the vaporizer well before storing it.  Stop using the vaporizer if worsening respiratory symptoms develop.   This information is not intended to replace advice given to you by your health care provider. Make sure you discuss any questions you have with your health care provider.   Document Released: 12/28/2003 Document Revised: 04/06/2013 Document Reviewed: 08/19/2012 Elsevier Interactive Patient Education 2016 Elsevier Inc.  Cough, Adult A cough helps to clear your throat and lungs. A cough may last only 2-3 weeks (acute), or it may last longer than 8 weeks (chronic). Many different things can cause a cough. A cough may be a sign of an illness or another medical condition. HOME CARE  Pay attention to any changes in your cough.  Take medicines only as told by your doctor.  If you were prescribed an antibiotic medicine, take it as told by your doctor. Do  not stop taking it even if you start to feel better.  Talk with your doctor before you try using a cough medicine.  Drink enough fluid to keep your pee (urine) clear or pale yellow.  If the air is dry, use a cold steam vaporizer or humidifier in your home.  Stay away from things that make you cough at work or at home.  If your cough is worse at night, try using extra pillows to raise your head up higher while you sleep.  Do not smoke, and try not to be around smoke. If you need help quitting, ask your doctor.  Do not have caffeine.  Do not drink alcohol.  Rest as needed. GET HELP IF:  You have new problems (symptoms).  You cough up yellow fluid (pus).  Your cough does not get better after 2-3 weeks, or your cough gets worse.  Medicine does not help your cough and you are not sleeping well.  You have pain that gets worse or pain that is not helped with medicine.  You have a fever.  You are losing weight and you do not know why.  You have night sweats. GET HELP RIGHT AWAY IF:  You cough up blood.  You have trouble breathing.  Your heartbeat is very fast.   This information is not intended to replace advice given to you by your health care provider. Make sure you discuss any questions you have with your health care provider.   Document Released: 12/13/2010  Document Revised: 12/21/2014 Document Reviewed: 06/08/2014 Elsevier Interactive Patient Education [employer]

## 2015-06-29 NOTE — ED Provider Notes (Signed)
CSN: [id number]     Arrival date & time 06/29/15  1330 History   First MD Initiated Contact with Patient 06/29/15 1343     Chief Complaint  Patient presents with  . Chest Pain     (Consider location/radiation/quality/duration/timing/severity/associated sxs/prior Treatment) HPI   Patient is a 50 year old male presenting with cough and congestion. Patient reports that he had a bout of allergies/virus a couple weeks ago. He reports that since that he's been having cough and occasional chest pain when he coughs. She reports a cough this been going on for a week and half so he wanted to get it checked out. Patient has no fever. Patient has mild congestion occasionally.  Chest tightness is not associated with diaphoresis, chest pain does not radiate. Chest pain only when coughing.  History reviewed. No pertinent past medical history. Past Surgical History  Procedure Laterality Date  . Laparoscopic appendectomy N/A 04/30/2015    Procedure: APPENDECTOMY LAPAROSCOPIC;  Surgeon: Luretha Murphy, MD;  Location: [facility];  Service: General;  Laterality: N/A;   No family history on file. Social History  Substance Use Topics  . Smoking status: Light Tobacco Smoker  . Smokeless tobacco: None  . Alcohol Use: Yes     Comment: occ    Review of Systems  Constitutional: Negative for fever and fatigue.  HENT: Positive for congestion.   Respiratory: Positive for cough. Negative for chest tightness, shortness of breath and wheezing.   Cardiovascular: Positive for chest pain.  Gastrointestinal: Negative for nausea, vomiting and abdominal pain.  Neurological: Negative for weakness.      Allergies  Review of patient's allergies indicates no known allergies.  Home Medications   Prior to Admission medications   Medication Sig Start Date End Date Taking? Authorizing Provider  acetaminophen (TYLENOL) 325 MG tablet Take 2 tablets (650 mg total) by mouth every 6 (six) hours as needed for mild pain,  moderate pain, fever or headache. 05/01/15   Sherrie George, PA-C  HYDROcodone-acetaminophen (NORCO/VICODIN) 5-325 MG tablet Take 1-2 tablets by mouth every 4 (four) hours as needed for moderate pain. 05/01/15   Sherrie George, PA-C  ibuprofen (MOTRIN IB) 200 MG tablet You can take 2-3 tablets every 6 hours as needed for pain.  I would use this first and then the prescription medicine. 05/01/15   Sherrie George, PA-C   BP 128/84 mmHg  Pulse 92  Temp(Src) 98 F (36.7 C) (Oral)  Resp 16  SpO2 98% Physical Exam  Constitutional: He is oriented to person, place, and time. He appears well-nourished.  HENT:  Head: Normocephalic.  Mouth/Throat: Oropharynx is clear and moist.  Mild nasal congestion.  Eyes: Conjunctivae are normal.  Neck: No tracheal deviation present.  Cardiovascular: Normal rate.   Pulmonary/Chest: Effort normal. No stridor. No respiratory distress.  Abdominal: Soft. There is no tenderness. There is no guarding.  Musculoskeletal: Normal range of motion. He exhibits no edema.  Neurological: He is oriented to person, place, and time. No cranial nerve deficit.  Skin: Skin is warm and dry. No rash noted. He is not diaphoretic.  Psychiatric: He has a normal mood and affect. His behavior is normal.  Nursing note and vitals reviewed.   ED Course  Procedures (including critical care time) Labs Review Labs Reviewed  CBC  BASIC METABOLIC PANEL  TROPONIN I    Imaging Review Dg Chest 2 View  06/29/2015  CLINICAL DATA:  Chest pain EXAM: CHEST  2 VIEW COMPARISON:  None. FINDINGS: The heart size and mediastinal contours  are within normal limits. Both lungs are clear. The visualized skeletal structures are unremarkable. IMPRESSION: No active cardiopulmonary disease. Electronically Signed   By: Marlan Palauharles  Clark M.D.   On: 06/29/2015 13:52   I have personally reviewed and evaluated these images and lab results as part of my medical decision-making.   EKG  Interpretation   Date/Time:  Thursday June 29 2015 13:34:08 EDT Ventricular Rate:  91 PR Interval:  174 QRS Duration: 90 QT Interval:  312 QTC Calculation: 383 R Axis:   -2 Text Interpretation:  Normal sinus rhythm Inferior infarct , age  undetermined Abnormal ECG no acute ischemia.  T wave inverted in V3  Confirmed by Corlis LeakMACKUEN, COURTNEY ([id number]) on 06/29/2015 1:45:28 PM      MDM   Final diagnoses:  None    Patient is a very healthy-appearing 50 year old male presenting with cough and congestion. Patient reports that he's had cough the last 2 weeks since he had a bout with a virus/allergies. He says he coughs sometimes at night sometimes during the day. His lung sounds are clear. No wheezing, no tachypnea. Chest x-ray normal.  Patient's 100% on room air. Patient has no risk factors for DVT/PE. PERC negative.  The chest pain is only with coughing, therefore not likely to be related to cardiac ischemia.  I suspect a post viral cough syndrome.  We will give him cough syrup and Tessalon Perles for home and have him follow-up with his primary care physician if he continues to cough longer than 2 weeks post virus.    Courteney Randall AnLyn Mackuen, MD 06/29/15 1408

## 2015-06-29 NOTE — ED Notes (Signed)
Pt c/o of generalized chest ache. States it has been going on x1 week since he had a cough/congestion. No hx. A/O at triage on cell phone.

## 2016-05-30 IMAGING — DX DG CHEST 2V
2 series · 2 of 2 positions shown · non-contrast
Comparison: None.

CLINICAL DATA: Chest pain

EXAM:
CHEST  2 VIEW

[chest pa]
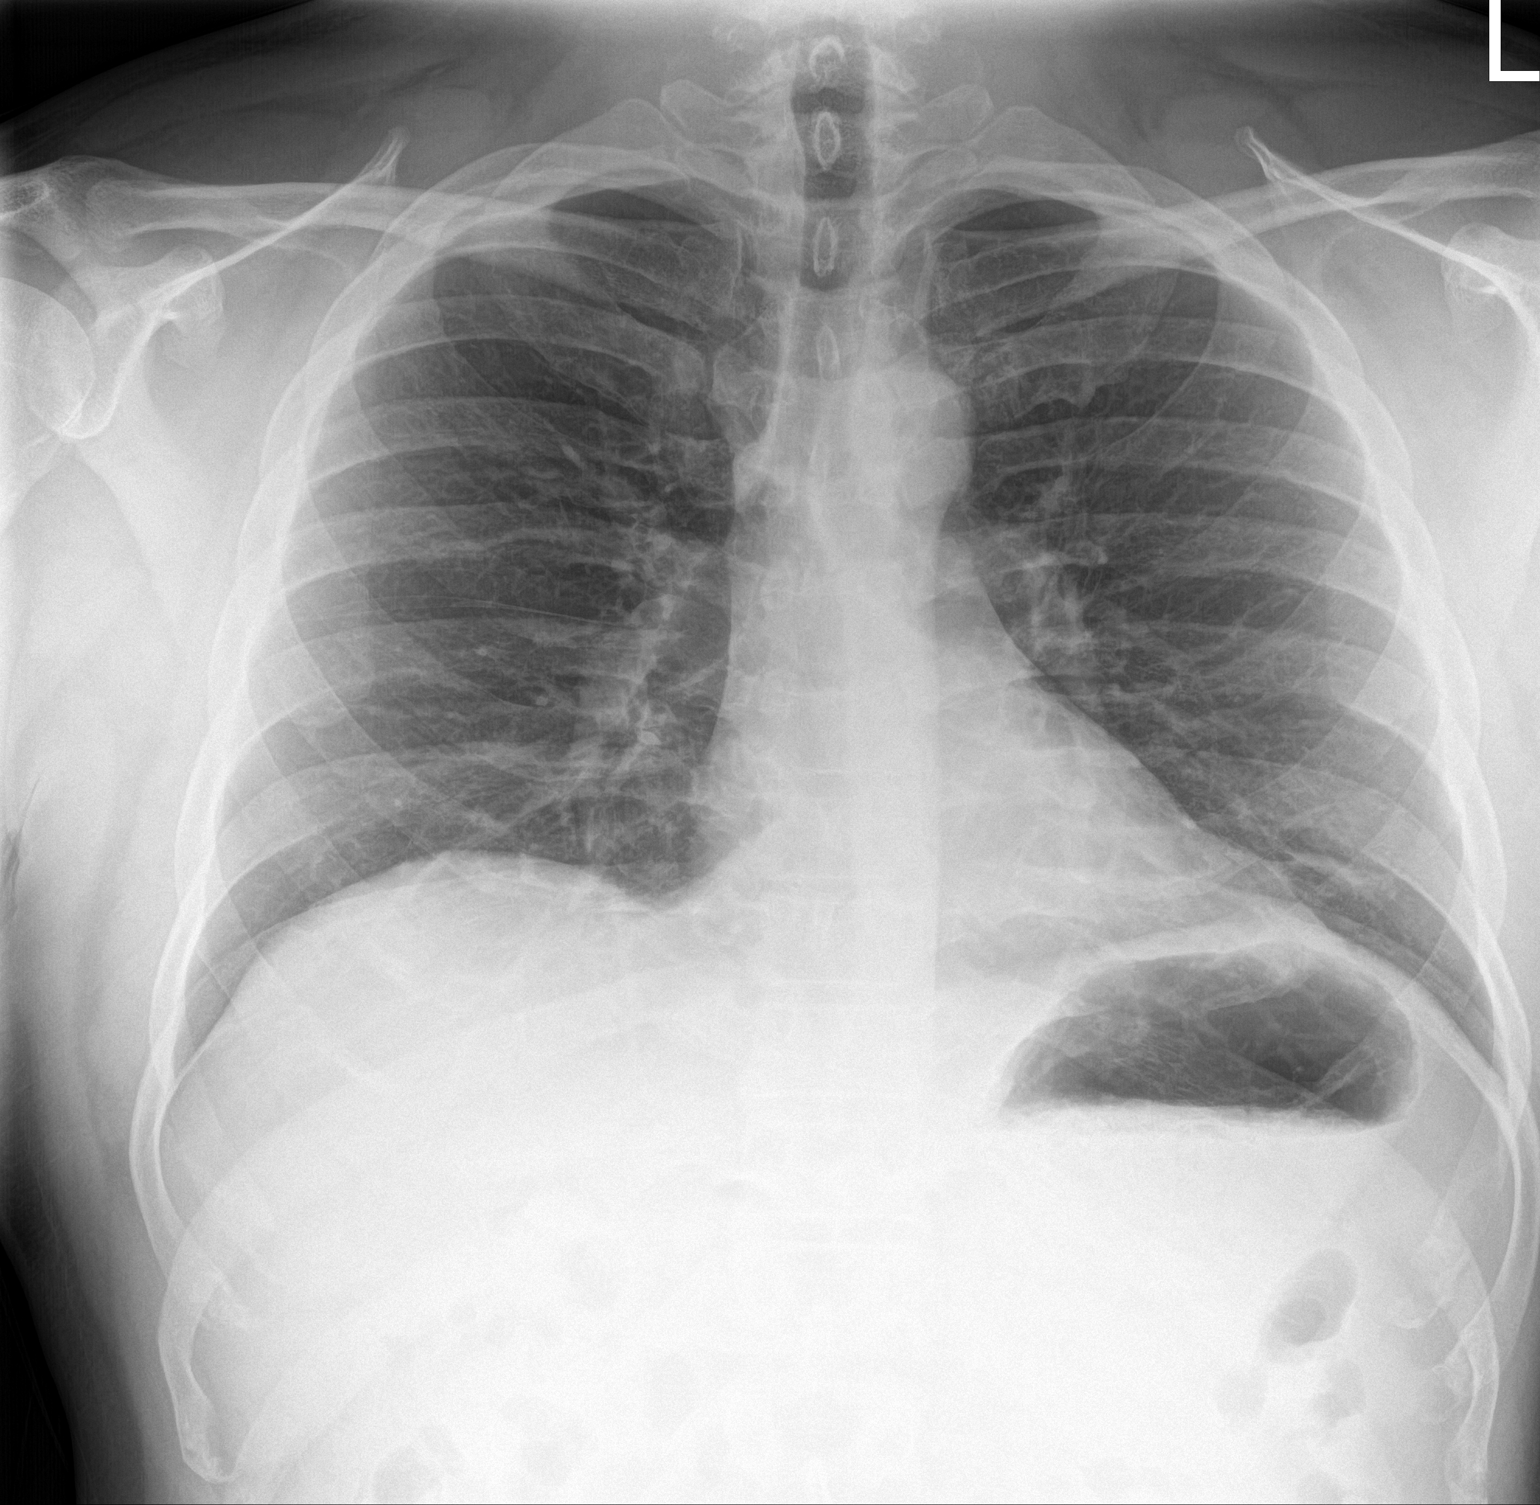

[chest lat]
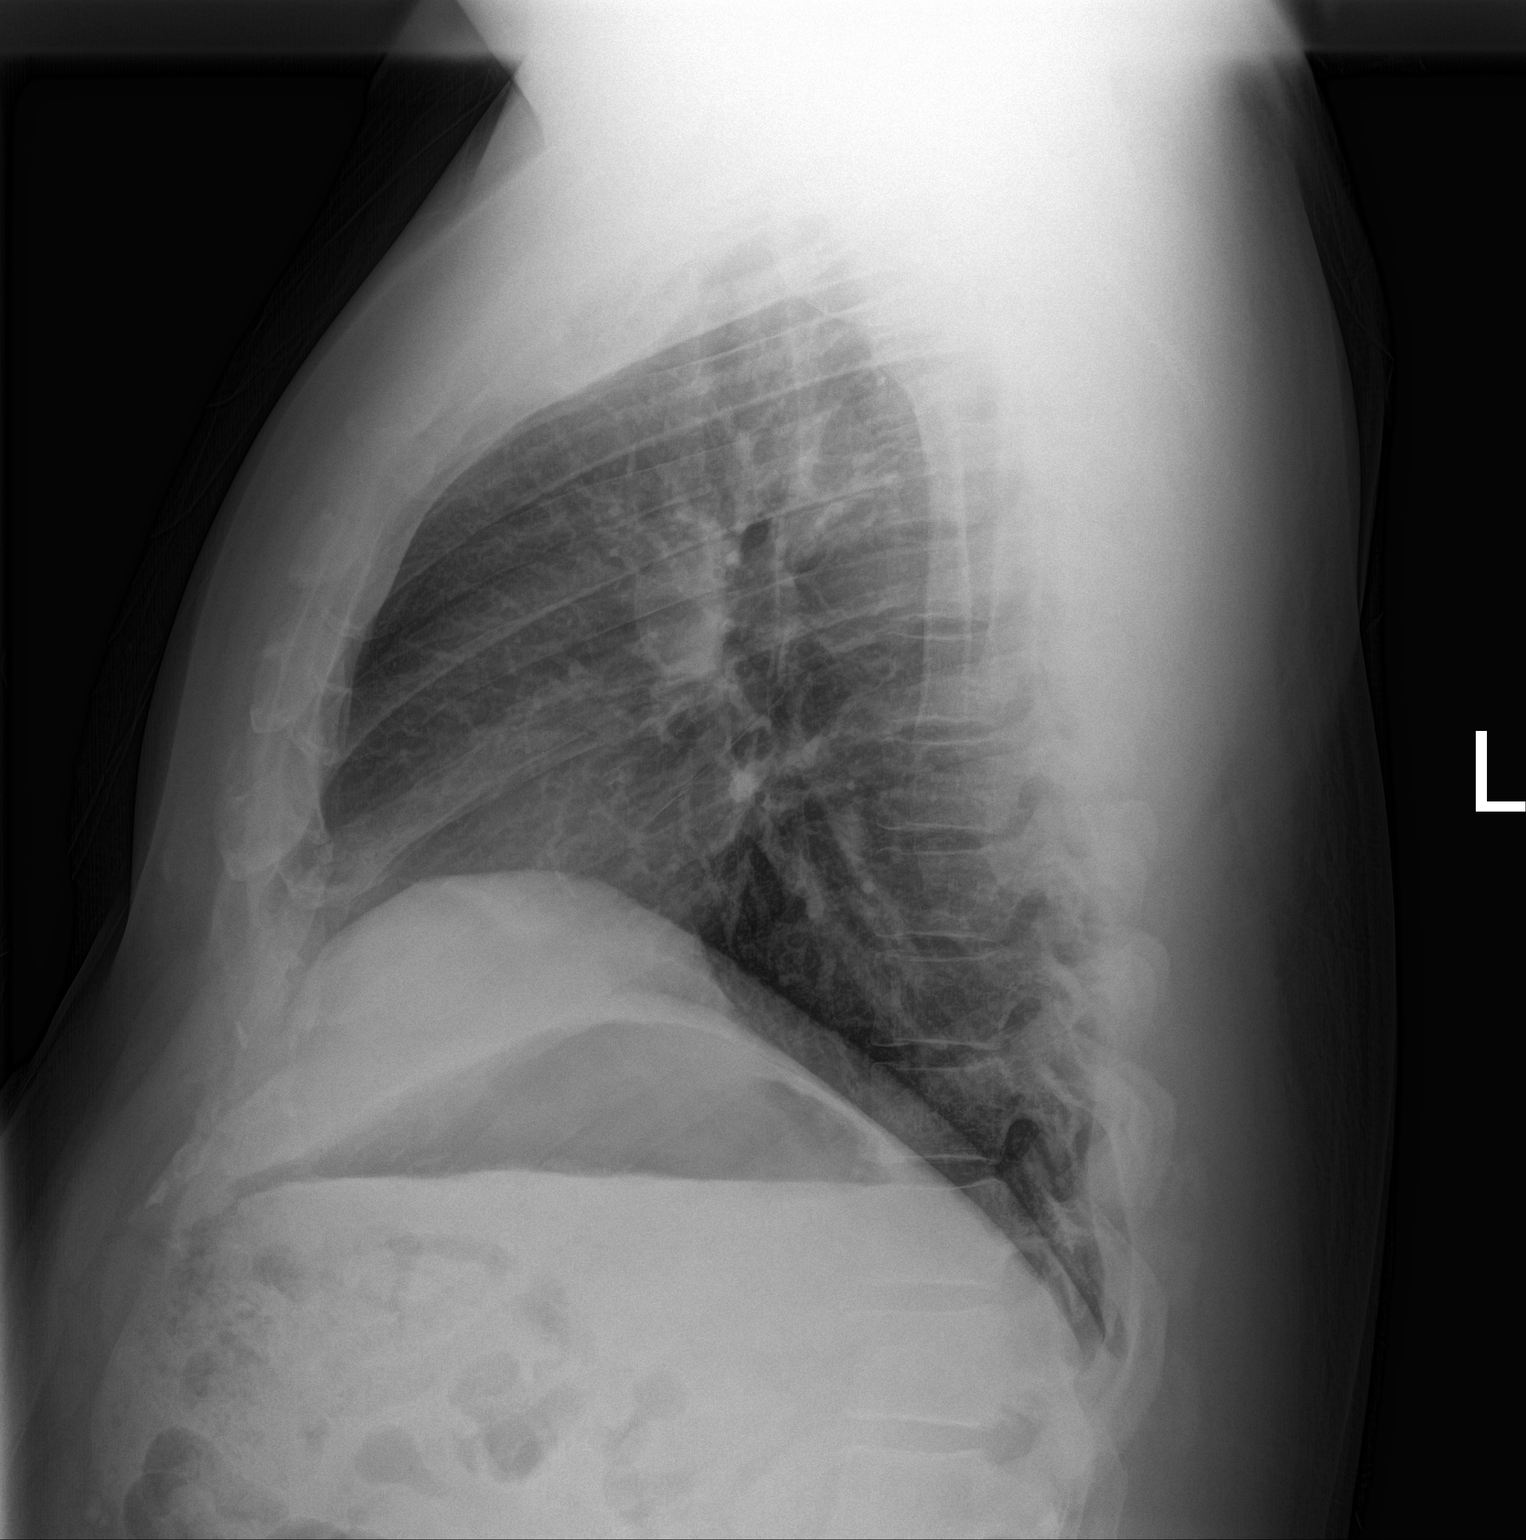

[2 of 2 positions shown; findings below may reference images not displayed]

FINDINGS: The heart size and mediastinal contours are within normal limits.
Both lungs are clear. The visualized skeletal structures are
unremarkable.
IMPRESSION: No active cardiopulmonary disease.
